# Patient Record
Sex: Male | Born: 2005 | Race: Black or African American | Hispanic: No | Marital: Single | Smoking: Never smoker
Health system: Southern US, Community
[De-identification: ages and names within clinical notes are randomized; demographics above are authoritative.]

---
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
## 2023-02-10 ENCOUNTER — Encounter: Payer: Self-pay | Admitting: *Deleted

## 2024-02-03 ENCOUNTER — Ambulatory Visit (HOSPITAL_COMMUNITY)
Admission: EM | Admit: 2024-02-03 | Discharge: 2024-02-04 | Disposition: A | Discharge status: Other Acute Inpatient Hospital | Attending: Psychiatry | Admitting: Psychiatry

## 2024-02-03 DIAGNOSIS — F515 Nightmare disorder: Secondary | ICD-10-CM | POA: Diagnosis not present

## 2024-02-03 DIAGNOSIS — R45851 Suicidal ideations: Secondary | ICD-10-CM | POA: Insufficient documentation

## 2024-02-03 DIAGNOSIS — F322 Major depressive disorder, single episode, severe without psychotic features: Secondary | ICD-10-CM | POA: Diagnosis not present

## 2024-02-03 LAB — CBC WITH DIFFERENTIAL/PLATELET
Abs Immature Granulocytes: 0.02 K/uL (ref 0.00–0.07)
Basophils Absolute: 0.1 K/uL (ref 0.0–0.1)
Basophils Relative: 1 %
Eosinophils Absolute: 0.1 K/uL (ref 0.0–0.5)
Eosinophils Relative: 1 %
HCT: 48.7 % (ref 39.0–52.0)
Hemoglobin: 16.1 g/dL (ref 13.0–17.0)
Immature Granulocytes: 0 %
Lymphocytes Relative: 33 %
Lymphs Abs: 2.5 K/uL (ref 0.7–4.0)
MCH: 30.7 pg (ref 26.0–34.0)
MCHC: 33.1 g/dL (ref 30.0–36.0)
MCV: 92.8 fL (ref 80.0–100.0)
Monocytes Absolute: 0.8 K/uL (ref 0.1–1.0)
Monocytes Relative: 10 %
Neutro Abs: 4.2 K/uL (ref 1.7–7.7)
Neutrophils Relative %: 55 %
Platelets: 233 K/uL (ref 150–400)
RBC: 5.25 MIL/uL (ref 4.22–5.81)
RDW: 13.6 % (ref 11.5–15.5)
WBC: 7.6 K/uL (ref 4.0–10.5)
nRBC: 0 % (ref 0.0–0.2)

## 2024-02-03 LAB — COMPREHENSIVE METABOLIC PANEL WITH GFR
ALT: 15 U/L (ref 0–44)
AST: 20 U/L (ref 15–41)
Albumin: 4.4 g/dL (ref 3.5–5.0)
Alkaline Phosphatase: 71 U/L (ref 38–126)
Anion gap: 10 (ref 5–15)
BUN: 13 mg/dL (ref 6–20)
CO2: 26 mmol/L (ref 22–32)
Calcium: 9.5 mg/dL (ref 8.9–10.3)
Chloride: 104 mmol/L (ref 98–111)
Creatinine, Ser: 1.22 mg/dL (ref 0.61–1.24)
GFR, Estimated: >60 mL/min (ref >60)
Glucose, Bld: 81 mg/dL (ref 70–99)
Potassium: 3.7 mmol/L (ref 3.5–5.1)
Sodium: 140 mmol/L (ref 135–145)
Total Bilirubin: 1 mg/dL (ref 0.0–1.2)
Total Protein: 7.7 g/dL (ref 6.5–8.1)

## 2024-02-03 LAB — URINALYSIS, ROUTINE W REFLEX MICROSCOPIC
Bacteria, UA: NONE SEEN
Bilirubin Urine: NEGATIVE
Glucose, UA: NEGATIVE mg/dL
Hgb urine dipstick: NEGATIVE
Ketones, ur: 5 mg/dL — AB
Leukocytes,Ua: NEGATIVE
Nitrite: NEGATIVE
Protein, ur: 30 mg/dL — AB
Specific Gravity, Urine: 1.029 (ref 1.005–1.030)
pH: 7 (ref 5.0–8.0)

## 2024-02-03 LAB — LIPID PANEL
Cholesterol: 188 mg/dL (ref 0–200)
HDL: 55 mg/dL (ref >40)
LDL Cholesterol: 120 mg/dL — ABNORMAL HIGH (ref 0–99)
Total CHOL/HDL Ratio: 3.4 ratio
Triglycerides: 67 mg/dL (ref <150)
VLDL: 13 mg/dL (ref 0–40)

## 2024-02-03 LAB — POCT URINE DRUG SCREEN - MANUAL ENTRY (I-SCREEN)
POC Amphetamine UR: NOT DETECTED
POC Buprenorphine (BUP): NOT DETECTED
POC Cocaine UR: NOT DETECTED
POC Marijuana UR: NOT DETECTED
POC Methadone UR: NOT DETECTED
POC Methamphetamine UR: NOT DETECTED
POC Morphine: NOT DETECTED
POC Oxazepam (BZO): NOT DETECTED
POC Oxycodone UR: NOT DETECTED
POC Secobarbital (BAR): NOT DETECTED

## 2024-02-03 LAB — MAGNESIUM: Magnesium: 2.1 mg/dL (ref 1.7–2.4)

## 2024-02-03 LAB — ETHANOL: Alcohol, Ethyl (B): <15 mg/dL (ref <15)

## 2024-02-03 LAB — TSH: TSH: 2.363 u[IU]/mL (ref 0.350–4.500)

## 2024-02-03 LAB — HEMOGLOBIN A1C
Hgb A1c MFr Bld: 4.7 % — ABNORMAL LOW (ref 4.8–5.6)
Mean Plasma Glucose: 88.19 mg/dL

## 2024-02-03 MED ORDER — HALOPERIDOL 5 MG PO TABS: 5.0000 mg | ORAL_TABLET | Freq: Three times a day (TID) | ORAL | Status: DC | PRN

## 2024-02-03 MED ORDER — DIPHENHYDRAMINE HCL 50 MG PO CAPS: 50.0000 mg | ORAL_CAPSULE | Freq: Three times a day (TID) | ORAL | Status: DC | PRN

## 2024-02-03 MED ORDER — ACETAMINOPHEN 325 MG PO TABS: 650.0000 mg | ORAL_TABLET | Freq: Four times a day (QID) | ORAL | Status: DC | PRN

## 2024-02-03 MED ORDER — DIPHENHYDRAMINE HCL 50 MG/ML IJ SOLN: 50.0000 mg | Freq: Three times a day (TID) | INTRAMUSCULAR | Status: DC | PRN

## 2024-02-03 MED ORDER — ALUM & MAG HYDROXIDE-SIMETH 200-200-20 MG/5ML PO SUSP: 30.0000 mL | ORAL | Status: DC | PRN

## 2024-02-03 MED ORDER — MAGNESIUM HYDROXIDE 400 MG/5ML PO SUSP: 30.0000 mL | Freq: Every day | ORAL | Status: DC | PRN

## 2024-02-03 MED ORDER — HALOPERIDOL LACTATE 5 MG/ML IJ SOLN: 10.0000 mg | Freq: Three times a day (TID) | INTRAMUSCULAR | Status: DC | PRN

## 2024-02-03 MED ORDER — LORAZEPAM 2 MG/ML IJ SOLN: 2.0000 mg | Freq: Three times a day (TID) | INTRAMUSCULAR | Status: DC | PRN

## 2024-02-03 MED ORDER — HALOPERIDOL LACTATE 5 MG/ML IJ SOLN: 5.0000 mg | Freq: Three times a day (TID) | INTRAMUSCULAR | Status: DC | PRN

## 2024-02-03 NOTE — ED Notes (Signed)
 Patient offered snacks. Patient accepted two bags of chips. Patient is eating snack and resting in recliner.

## 2024-02-03 NOTE — ED Notes (Signed)
 Patient currently resting in recliner. RR even and unlabored, appearing in no noted distress. Environmental check complete

## 2024-02-03 NOTE — ED Notes (Signed)
 Patient calm and cooperative during initial nurse shift assessment. Pt observed/assessed resting in recliner. Patient denies any SI/AVH/ HI. Patient encouraged to eat meals and drink fluids. Patient refused. Patient states that currently he has no desire to eat right now. RR even and unlabored, appearing in no noted distress. Environmental check complete.

## 2024-02-03 NOTE — BH Assessment (Signed)
 Comprehensive Clinical Assessment (CCA) Note  02/03/2024 Spencer Bradley [id number]  DISPOSITION: Patient has been recommended for an inpatient admission to assist with stabilization.   The patient demonstrates the following risk factors for suicide: Chronic risk factors for suicide include: psychiatric disorder of SI . Acute risk factors for suicide include: loss (financial, interpersonal, professional). Protective factors for this patient include: coping skills. Considering these factors, the overall suicide risk at this point appears to be high. Patient is not appropriate for outpatient follow up.   Patient is a 18 year old male that presents this date to [facility] voluntary brought in by [employer] [facility] police after he was reported as a missing person the last 4 days by his family. Patient continues to report passive SI although denies any active plan or intent at the time of assessment. Patient denies any HI or AVH. Patient denies any previous history of a mental health disorder or prior attempts or gestures at self harm prior to this incident. 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 Patient states on Tuesday of this week he, decided to take off and stay with a friend, and did not inform anyone of his departure from campus.   Patient states at that time he became suicidal and took his friend's gun and put it to his head, although decided, not to do it. Patient states he did not want to get his friend in trouble since he was not aware of the circumstances associated with his actions nor was his friend aware that he left campus without telling anyone. Patient states he has been feeling, so depressed over the last two weeks, reporting current symptoms to include feeling hopeless and worthless due to relationship issues (patient states his girlfriend recently got pregnant and had an abortion) that he feels guilty over, poor grades in school and the recent loss of his grandfather  which his family did not inform him of until after the fact since he, had so much going on anyway. Patient also states he received multiple injuries while playing football that is keeping him from playing the season at [employer]. Patient states he became very overwhelmed earlier this week and as mentioned above left campus on Tuesday to go stay with his friend for 4 days that resulted in his parents filing a missing persons report. Patient denies any current OP services associated with mental health, prior inpatient admissions or diagnosis. Patient states after he decided, not to shoot himself, on Tuesday he thought about, jumping off a nearby parking deck. Patient currently denies access to weapons, legal charges or SA issues.  Patient is alert and oriented x 5. Patient speaks in a low soft voice that is difficult to understand at times and is not forthcoming with responses rendering limited history. Patient's memory appears to be intact with thoughts organized. Patient's mood is depressed with affect congruent. Patient does not appear to be responding to internal stimuli. Patient agrees to an voluntary admission at this time and meets criteria for an inpatient admission.     Chief Complaint:  Chief Complaint  Patient presents with   Suicidal Thoughts   Depression   Visit Diagnosis: MDD recurrent without psychotic features, severe     CCA Screening, Triage and Referral (STR)  Patient Reported Information How did you hear about us ? Legal System  What Is the Reason for Your Visit/Call Today? Spencer Bradley is a 18 year old male presenting to [facility] via [employer] [facility] police vol. Pt states he  was brought here today due to his family reporting him as a missing person. Pt states 4 days ago he went missing so he could act on his suicidal thougths.Pt states that he wanted to either shoot himself or jump off a parking deck. Pt currently has SI but does not have a plan and would not act on past plans if he left today. Pt  denies AVH, HI, and substance use. Pt states he would like therpay services and is not open to medication. Pt states he has no mental health diagnosis.  How Long Has This Been Causing You Problems? 1 wk - 1 month  What Do You Feel Would Help You the Most Today? Treatment for Depression or other mood problem   Have You Recently Had Any Thoughts About Hurting Yourself? Yes  Are You Planning to Commit Suicide/Harm Yourself At This time? No   Flowsheet Row ED from 02/03/2024 in [facility]  C-SSRS RISK CATEGORY High Risk    Have you Recently Had Thoughts About Hurting Someone Spencer Bradley? No  Are You Planning to Harm Someone at This Time? No  Explanation: NA   Have You Used Any Alcohol or Drugs in the Past 24 Hours? No  How Long Ago Did You Use Drugs or Alcohol? NA What Did You Use and How Much? NA  Do You Currently Have a Therapist/Psychiatrist? No  Name of Therapist/Psychiatrist: NA   Have You Been Recently Discharged From Any Office Practice or Programs? No  Explanation of Discharge From Practice/Program: NA    CCA Screening Triage Referral Assessment Type of Contact: Face-to-Face  Telemedicine Service Delivery:  02/03/2024 Is this Initial or Reassessment? initial  Date Telepsych consult ordered in [facility]:  02/03/2024  Time Telepsych consult ordered in [facility]:  02/03/2024  Location of Assessment: [facility] Services  Provider Location: [facility] Assessment Services   Collateral Involvement: None at this time   Does Patient Have a Automotive engineer Guardian? No  Legal Guardian Contact Information: NA  Copy of Legal Guardianship Form: -- (NA)  Legal Guardian Notified of Arrival: -- (NA)  Legal Guardian Notified of Pending Discharge: -- (NA)  If Minor and Not Living with Parent(s), Who has Custody? NA  Is CPS involved or ever been involved? Never  Is APS involved or ever been involved? Never   Patient Determined To Be At Risk for Harm  To Self or Others Based on Review of Patient Reported Information or Presenting Complaint? Yes, for Self-Harm  Method: No Plan  Availability of Means: No access or NA  Intent: Vague intent or NA  Notification Required: No need or identified person  Additional Information for Danger to Others Potential: -- (NA)  Additional Comments for Danger to Others Potential: None noted  Are There Guns or Other Weapons in Your Home? No  Types of Guns/Weapons: NA  Are These Weapons Safely Secured?                            -- (NA)  Who Could Verify You Are Able To Have These Secured: NA  Do You Have any Outstanding Charges, Pending Court Dates, Parole/Probation? Patient denies  Contacted To Inform of Risk of Harm To Self or Others: Other: Comment (NA)    Does Patient Present under Involuntary Commitment? No    [place] of Residence: [place]   Patient Currently Receiving the Following Services: Not Receiving Services   Determination of Need: Urgent (  48 hours)   Options For Referral: Inpatient Hospitalization     CCA Biopsychosocial Patient Reported Schizophrenia/Schizoaffective Diagnosis in Past: No   Strengths: Patient is willing to participate in treatment   Mental Health Symptoms Depression:  Change in energy/activity; Worthlessness; Hopelessness   Duration of Depressive symptoms: Duration of Depressive Symptoms: Less than two weeks   Mania:  None   Anxiety:   Difficulty concentrating; Worrying; Tension   Psychosis:  None   Duration of Psychotic symptoms:    Trauma:  None   Obsessions:  None   Compulsions:  None   Inattention:  None   Hyperactivity/Impulsivity:  None   Oppositional/Defiant Behaviors:  None   Emotional Irregularity:  Chronic feelings of emptiness   Other Mood/Personality Symptoms:  None noted    Mental Status Exam Appearance and self-care  Stature:  Tall   Weight:  Average weight   Clothing:  Casual   Grooming:  Normal    Cosmetic use:  None   Posture/gait:  Normal   Motor activity:  Not Remarkable   Sensorium  Attention:  Normal   Concentration:  Normal   Orientation:  X5   Recall/memory:  Normal   Affect and Mood  Affect:  Anxious; Depressed   Mood:  Depressed; Anxious   Relating  Eye contact:  Normal   Facial expression:  Anxious; Depressed   Attitude toward examiner:  Cooperative   Thought and Language  Speech flow: Clear and Coherent   Thought content:  Appropriate to Mood and Circumstances   Preoccupation:  None   Hallucinations:  None   Organization:  Goal-directed; Intact   [employer] of Knowledge:  Fair   Intelligence:  Average   Abstraction:  Normal   Judgement:  Fair   Brewing technologist   Insight:  Fair   Decision Making:  Normal   Social Functioning  Social Maturity:  Responsible   Social Judgement:  Normal   Stress  Stressors:  Relationship; School; Grief/losses   Coping Ability:  Human resources officer Deficits:  Activities of daily living   Supports:  Family     Religion: Religion/Spirituality Are You A Religious Person?: No How Might This Affect Treatment?: NA  Leisure/Recreation: Leisure / Recreation Do You Have Hobbies?: No  Exercise/Diet: Exercise/Diet Do You Exercise?: No Have You Gained or Lost A Significant Amount of Weight in the Past Six Months?: No Do You Follow a Special Diet?: No Do You Have Any Trouble Sleeping?: No   CCA Employment/Education Employment/Work Situation: Employment / Work Situation Employment Situation: Surveyor, minerals Job has Been Impacted by Current Illness: No Has Patient ever Been in the [employer]?: No  Education: Education Is Patient Currently Attending School?: Yes School Currently Attending: [employer] Last Grade Completed: 15 Did You Product manager?: Yes What Type of College Degree Do you Have?: Currently a junior at [employer] in Psychology Did You Have An Individualized  Education Program (IIEP): No Did You Have Any Difficulty At School?: No Patient's Education Has Been Impacted by Current Illness: No   CCA Family/Childhood History Family and Relationship History: Family history Marital status: Single Does patient have children?: No  Childhood History:  Childhood History By whom was/is the patient raised?: Both parents Did patient suffer any verbal/emotional/physical/sexual abuse as a child?: No Did patient suffer from severe childhood neglect?: No Has patient ever been sexually abused/assaulted/raped as an adolescent or adult?: No Was the patient ever a victim of a crime or a disaster?: No Witnessed domestic  violence?: No Has patient been affected by domestic violence as an adult?: No       CCA Substance Use Alcohol/Drug Use: Alcohol / Drug Use Pain Medications: See MAR Prescriptions: See MAR Over the Counter: See MAR History of alcohol / drug use?: No history of alcohol / drug abuse Longest period of sobriety (when/how long): NA Negative Consequences of Use:  (NA) Withdrawal Symptoms:  (NA)                         ASAM's:  Six Dimensions of Multidimensional Assessment  Dimension 1:  Acute Intoxication and/or Withdrawal Potential:   Dimension 1:  Description of individual's past and current experiences of substance use and withdrawal: NA  Dimension 2:  Biomedical Conditions and Complications:   Dimension 2:  Description of patient's biomedical conditions and  complications: NA  Dimension 3:  Emotional, Behavioral, or Cognitive Conditions and Complications:  Dimension 3:  Description of emotional, behavioral, or cognitive conditions and complications: NA  Dimension 4:  Readiness to Change:  Dimension 4:  Description of Readiness to Change criteria: NA  Dimension 5:  Relapse, Continued use, or Continued Problem Potential:  Dimension 5:  Relapse, continued use, or continued problem potential critiera description: NA  Dimension  6:  Recovery/Living Environment:  Dimension 6:  Recovery/Iiving environment criteria description: NA  ASAM Severity Score:    ASAM Recommended Level of Treatment: ASAM Recommended Level of Treatment:  (NA)   Substance use Disorder (SUD) Substance Use Disorder (SUD)  Checklist Symptoms of Substance Use:  (NA)  Recommendations for Services/Supports/Treatments: Recommendations for Services/Supports/Treatments Recommendations For Services/Supports/Treatments:  (NA)  Disposition Recommendation per psychiatric provider: We recommend inpatient psychiatric hospitalization when medically cleared. Patient is under voluntary admission status at this time; please IVC if attempts to leave hospital.   DSM5 Diagnoses: There are no active problems to display for this patient.    Referrals to Alternative Service(s): Referred to Alternative Service(s):   Place:   Date:   Time:    Referred to Alternative Service(s):   Place:   Date:   Time:    Referred to Alternative Service(s):   Place:   Date:   Time:    Referred to Alternative Service(s):   Place:   Date:   Time:     Alm LITTIE Furth, LCAS

## 2024-02-03 NOTE — ED Notes (Signed)
 Pt presented to [facility] voluntarily and admitted to continuous assessment unit w/ c/o suicidal thoughts and depression. Hx includes: none. Pt went missing for 4 days and was found at a friend's house. Pt had planned to shoot himself or jump off a parking deck per chart review. When asked about triggers pt just stated that he had a lot going on. Denies current SI/HI/AVH. Pt depressed, sad, calm and cooperative. Skin assessment: remarkable for abrasion to R elbow from football . Oriented to unit. Denies need of anything at this time. Pt in NAD at this time. Encouragement and support given. Will continue to monitor.

## 2024-02-03 NOTE — Progress Notes (Signed)
   02/03/24 1623  [facility] Triage Screening (Walk-ins at [facility] only)  How Did You Hear About Us ? Legal System  What Is the Reason for Your Visit/Call Today? Spencer Bradley is a 18 year old male presenting to [facility] via [employer] [facility] police vol. Pt states he was brought here today due to his family reporting him as a missing person. Pt states 4 days ago he went missing so he could act on his suicidal thougths.Pt states that he wanted to either shoot himself or jump off a parking deck. Pt currently has SI but does not have a plan and would not act on past plans if he left today. Pt denies AVH, HI, and substance use. Pt states he would like therpay services and is not open to medication. Pt states he has no mental health diagnosis.  How Long Has This Been Causing You Problems? <Week  Have You Recently Had Any Thoughts About Hurting Yourself? Yes  How long ago did you have thoughts about hurting yourself? Today  Are You Planning to Commit Suicide/Harm Yourself At This time? No  Have you Recently Had Thoughts About Hurting Someone [name]? No  Are You Planning To Harm Someone At This Time? No  Physical Abuse Denies  Verbal Abuse Denies  Sexual Abuse Denies  Exploitation of patient/patient's resources Denies  Self-Neglect Denies  Are you currently experiencing any auditory, visual or other hallucinations? No  Have You Used Any Alcohol or Drugs in the Past 24 Hours? No  Do you have any current medical co-morbidities that require immediate attention? No  Clinician description of patient physical appearance/behavior: Pt appears anxiois and is avoiding eye contact.  What Do You Feel Would Help You the Most Today? Treatment for Depression or other mood problem;Medication(s)  If access to [facility]gent [facility] was not available, would you have sought care in the Emergency Department? No  Determination of Need Urgent (48 hours)  Options For Referral Inpatient Hospitalization;BH Urgent Care;Intensive Outpatient Therapy;Outpatient  Therapy  Determination of Need filed? Yes
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
## 2024-02-04 ENCOUNTER — Other Ambulatory Visit: Payer: Self-pay

## 2024-02-04 ENCOUNTER — Inpatient Hospital Stay (HOSPITAL_COMMUNITY)
Admission: AD | Admit: 2024-02-04 | Discharge: 2024-02-07 | DRG: 885 | Disposition: A | Discharge status: Home / Self Care | Source: Intra-hospital

## 2024-02-04 ENCOUNTER — Encounter (HOSPITAL_COMMUNITY): Payer: Self-pay | Admitting: Psychiatry

## 2024-02-04 DIAGNOSIS — F332 Major depressive disorder, recurrent severe without psychotic features: Principal | ICD-10-CM | POA: Diagnosis present

## 2024-02-04 DIAGNOSIS — R45851 Suicidal ideations: Secondary | ICD-10-CM | POA: Diagnosis present

## 2024-02-04 DIAGNOSIS — F419 Anxiety disorder, unspecified: Secondary | ICD-10-CM | POA: Diagnosis present

## 2024-02-04 MED ORDER — TRAZODONE HCL 50 MG PO TABS: 50.0000 mg | ORAL_TABLET | Freq: Every evening | ORAL | Status: DC | PRN

## 2024-02-04 MED ORDER — MAGNESIUM HYDROXIDE 400 MG/5ML PO SUSP: 30.0000 mL | Freq: Every day | ORAL | Status: DC | PRN

## 2024-02-04 MED ORDER — DIPHENHYDRAMINE HCL 50 MG/ML IJ SOLN: 50.0000 mg | Freq: Three times a day (TID) | INTRAMUSCULAR | Status: DC | PRN

## 2024-02-04 MED ORDER — ACETAMINOPHEN 325 MG PO TABS: 650.0000 mg | ORAL_TABLET | Freq: Four times a day (QID) | ORAL | Status: DC | PRN

## 2024-02-04 MED ORDER — DIPHENHYDRAMINE HCL 25 MG PO CAPS: 50.0000 mg | ORAL_CAPSULE | Freq: Three times a day (TID) | ORAL | Status: DC | PRN

## 2024-02-04 MED ORDER — HALOPERIDOL LACTATE 5 MG/ML IJ SOLN: 5.0000 mg | Freq: Three times a day (TID) | INTRAMUSCULAR | Status: DC | PRN

## 2024-02-04 MED ORDER — LORAZEPAM 2 MG/ML IJ SOLN: 2.0000 mg | Freq: Three times a day (TID) | INTRAMUSCULAR | Status: DC | PRN

## 2024-02-04 MED ORDER — HYDROXYZINE HCL 25 MG PO TABS: 25.0000 mg | ORAL_TABLET | Freq: Three times a day (TID) | ORAL | Status: DC | PRN

## 2024-02-04 MED ORDER — HALOPERIDOL LACTATE 5 MG/ML IJ SOLN: 10.0000 mg | Freq: Three times a day (TID) | INTRAMUSCULAR | Status: DC | PRN

## 2024-02-04 MED ORDER — HALOPERIDOL 5 MG PO TABS: 5.0000 mg | ORAL_TABLET | Freq: Three times a day (TID) | ORAL | Status: DC | PRN

## 2024-02-04 MED ORDER — LORAZEPAM 2 MG/ML IJ SOLN
2.0000 mg | Freq: Three times a day (TID) | INTRAMUSCULAR | Status: DC | PRN
Start: 2024-02-04 — End: 2024-02-07

## 2024-02-04 MED ORDER — ALUM & MAG HYDROXIDE-SIMETH 200-200-20 MG/5ML PO SUSP: 30.0000 mL | ORAL | Status: DC | PRN

## 2024-02-04 NOTE — Tx Team (Signed)
 Initial Treatment Plan 02/04/2024 5:13 AM Spencer Bradley FMW:[id number]    PATIENT STRESSORS: Loss of grandfathers   Marital or family conflict   Traumatic event     PATIENT STRENGTHS: Average or above average intelligence  Communication skills  Supportive family/friends    PATIENT IDENTIFIED PROBLEMS: Depression  Suicidal Ideation          I don't know         DISCHARGE CRITERIA:  Improved stabilization in mood, thinking, and/or behavior Motivation to continue treatment in a less acute level of care Need for constant or close observation no longer present Verbal commitment to aftercare and medication compliance  PRELIMINARY DISCHARGE PLAN: Outpatient therapy Return to previous living arrangement Return to previous work or school arrangements  PATIENT/FAMILY INVOLVEMENT: This treatment plan has been presented to and reviewed with the patient, Teacher, early years/pre.  The patient and family have been given the opportunity to ask questions and make suggestions.  Effie Naomie Norris, RN 02/04/2024, 5:13 AM
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
## 2024-02-04 NOTE — Group Note (Signed)
 Date:  02/04/2024 Time:  4:18 PM  Group Topic/Focus:  Managing Feelings:   The focus of this group is to identify what feelings patients have difficulty handling and develop a plan to handle them in a healthier way upon discharge.    Participation Level:  Did Not Attend    Spencer Bradley 02/04/2024, 4:18 PM

## 2024-02-04 NOTE — B[facility] Group Notes (Signed)
 B[facility] Group Notes:  (Nursing/MHT/Case Management/Adjunct)  Date:  02/04/2024  Time:  9:10 PM  Type of Therapy:  Wrap-up group  Participation Level:  Active  Participation Quality:  Appropriate  Affect:  Appropriate  Cognitive:  Appropriate  Insight:  Appropriate  Engagement in Group:  Engaged  Modes of Intervention:  Education  Summary of Progress/Problems:Goal to sleep. Rated day 8/10.  Spencer Bradley 02/04/2024, 9:10 PM

## 2024-02-04 NOTE — Group Note (Signed)
 Date:  02/04/2024 Time:  9:16 AM  Group Topic/Focus:  Goals Group:   The focus of this group is to help patients establish daily goals to achieve during treatment and discuss how the patient can incorporate goal setting into their daily lives to aide in recovery.    Participation Level:  Did Not Attend  Participation Quality:  NA  Affect:  NA  Cognitive:  NA  Insight: None  Engagement in Group:  NA  Modes of Intervention:  NA  Additional Comments:  NA  Teria Khachatryan A Mick Tanguma 02/04/2024, 9:16 AM

## 2024-02-04 NOTE — Progress Notes (Signed)
   02/04/24 2210  Psych Admission Type (Psych Patients Only)  Admission Status Voluntary  Psychosocial Assessment  Patient Complaints None  Eye Contact Brief  Facial Expression Flat  Affect Appropriate to circumstance  Speech Logical/coherent  Interaction Assertive  Motor Activity Other (Comment) (WNL)  Appearance/Hygiene Unremarkable  Behavior Characteristics Cooperative;Appropriate to situation  Mood Depressed;Pleasant  Thought Process  Coherency WDL  Content WDL  Delusions None reported or observed  Perception WDL  Hallucination None reported or observed  Judgment Poor  Confusion None  Danger to Self  Current suicidal ideation? Denies
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
## 2024-02-04 NOTE — Progress Notes (Signed)
 Patient isolative to his room most of the shift, he remains guarded on approach, but denies SI, HI & AVH.

## 2024-02-04 NOTE — Plan of Care (Signed)
   Problem: Education: Goal: Knowledge of [place] [facility] Education information/materials will improve Outcome: Progressing

## 2024-02-04 NOTE — ED Notes (Signed)
 Patient currently sleeping and resting in recliner. RR even and unlabored, appearing in no noted distress. Environmental check complete

## 2024-02-04 NOTE — [facility] Suicide Risk Assessment (Signed)
 [facility] Admission Suicide Risk Assessment   Total Time spent with patient: 45 minutes Principal Problem: Major depressive disorder, recurrent severe without psychotic features (HCC) Diagnosis:  Principal Problem:   Major depressive disorder, recurrent severe without psychotic features (HCC)   Suicide Risk: The patient presents with acute risk factors for suicide including active depressive symptoms, psychosocial stressors (sports injury, relationship stressors), recent SI (last endorsed on 4 days ago - Tuesday, 9/2), and recent suicidal gesture. Specifically, the patient took a friend's gun and had a plan to shoot self or jump off something to end his life. Additional chronic risk factors include male gender, poor coping skills, impulsivity, negative view towards medications and no current outpatient follow up. Risk factors are mitigated by resolution of SI prior to hospitalization (denying suicidal thoughts x3 days), open to inpatient admission and working with therapy, reports of good family support (parents, siblings, friends), no substance use concerns, domiciled and attending school. He today did present today with future orientation, discussing plans to pursue professional football and reconnect with friends.   At this time, current risk of suicide is deemed moderate, largely based on ongoing guardedness on interview, severity of recent suicidal thoughts and gesture as well as declining medications. We will further mitigate risk with ongoing hospitalization, will discuss medications further and encourage him to consider this and will provide group therapy. Prior to discharge we will ensure he is reconnected with therapy services.    I certify that inpatient services furnished can reasonably be expected to improve the patient's condition.   Leita LOISE Arts, MD 02/04/2024, 3:05 PM

## 2024-02-04 NOTE — Progress Notes (Signed)
 Pt is a 18 year old AAM admitted due to increasing depression and suicidal ideation.  Pt has lost two grandfathers in the last two years and was upset that parents did not tell him about the first.  Pt's girlfriend also has recently had an abortion and Pt feels guilty that he may have pressured her into th is decision.  Pt states that he feels overwhelmed.  Pt was reported missing for four days and was brought in to the hospital by [facility] police.  Pt appears depressed, but is cooperative with the admission process. Pt oriented to unit.   02/04/24 0506  Psych Admission Type (Psych Patients Only)  Admission Status Voluntary  Psychosocial Assessment  Patient Complaints Depression;Crying spells  Eye Contact Fair  Facial Expression Sad  Affect Apprehensive;Appropriate to circumstance  Speech Logical/coherent  Interaction Assertive  Motor Activity Other (Comment) (WDL)  Appearance/Hygiene In scrubs  Behavior Characteristics Appropriate to situation  Mood Depressed  Thought Process  Coherency WDL  Content WDL  Delusions None reported or observed  Perception WDL  Hallucination None reported or observed  Judgment Poor  Confusion None  Danger to Self  Current suicidal ideation? Denies  Description of Suicide Plan denies  Agreement Not to Harm Self Yes  Description of Agreement verbal  Danger to Others  Danger to Others None reported or observed

## 2024-02-04 NOTE — Progress Notes (Signed)
(  Sleep Hours) - 0.25 hours (Any PRNs that were needed, meds refused, or side effects to meds)-  None (Any disturbances and when (visitation, over night)- Admitted at 0430 (Concerns raised by the patient)-  None (SI/HI/AVH)-  Denies

## 2024-02-04 NOTE — Plan of Care (Signed)
  Problem: Activity: Goal: Interest or engagement in activities will improve Outcome: Not Progressing   

## 2024-02-05 NOTE — Progress Notes (Signed)
 D. Pt presented friendly, observed standing at the nursing station conversing with staff. Pt asked about discharge and when he will be speaking with the provider. Pt reported sleeping well last night, described his appetite as 'fair', concentration as 'good', and energy level as 'high'. Per pt's self inventory, pt rated his depression,hopelessness and anxiety all 0's. Pt's stated goal today is to go to rec/ courtyard). Pt currently denies SI/HI and AVH and doesn't appear to be responding to internal stimuli  A. Labs and vitals monitored. Pt  supported emotionally and encouraged to express concerns and ask questions.   R. Pt remains safe with 15 minute checks. Will continue POC.

## 2024-02-05 NOTE — Group Note (Signed)
 Date:  02/05/2024 Time:  10:03 AM  Group Topic/Focus:  Coping Mechanisms This group focused on coping mechanisms and understanding the importance of developing healthier ways to manage stress, emotional distress, and mental health challenges. The group explored healthy vs. nonhealthy coping mechanisms, emphasizing how some behaviors like substance abuse or avoidance can worsen mental health, while others like adequate sleep and food and relaxation techniques can lead to better emotional regulation and overall well-being.  A worksheet was provided to participants and highlighted adaptive vs. maladaptive coping mechanisms. Participants were encouraged to share their personal experiences and engage in conversation focused on resilience, improving self-awareness, and practicing deep breathing as a positive coping mechanism.    Participation Level:  Minimal  Participation Quality:  Appropriate and Attentive  Affect:  Appropriate  Cognitive:  Alert and Appropriate  Insight: Improving and Lacking  Engagement in Group:  Improving and Lacking  Modes of Intervention:  Discussion, Education, and Problem-solving  Additional Comments:  Pt attended this group, actively listened but did not verbally participate.  Spencer Bradley 02/05/2024, 10:03 AM

## 2024-02-05 NOTE — Group Note (Signed)
 Date:  02/05/2024 Time:  9:23 AM  Group Topic/Focus:  Goals Group:   The focus of this group is to help patients establish daily goals to achieve during treatment and discuss how the patient can incorporate goal setting into their daily lives to aide in recovery.    Participation Level:  Active  Participation Quality:  Appropriate  Affect:  Appropriate  Cognitive:  Alert and Appropriate  Insight: Appropriate and Improving  Engagement in Group:  Engaged and Improving  Modes of Intervention:  Discussion and Exploration  Additional Comments:  Pt attended and participated in goals group.  Kristi HERO Marquette Blodgett 02/05/2024, 9:23 AM

## 2024-02-05 NOTE — Progress Notes (Signed)
(  Sleep Hours) -6.0 (Any PRNs that were needed, meds refused, or side effects to meds)- none (Any disturbances and when (visitation, over night)-none (Concerns raised by the patient)- none (SI/HI/AVH)- Denies all

## 2024-02-05 NOTE — Plan of Care (Signed)
   Problem: Education: Goal: Knowledge of [place] [facility] Education information/materials will improve Outcome: Progressing Goal: Emotional status will improve Outcome: Progressing Goal: Mental status will improve Outcome: Progressing Goal: Verbalization of understanding the information provided will improve Outcome: Progressing

## 2024-02-05 NOTE — Group Note (Signed)
 Date:  02/05/2024 Time:  3:58 PM  Group Topic/Focus:  The focus of this group is to introduce the topic of wellness and how collage can be used as a creative outlet for expressing emotions, reducing anxiety, while fostering group cohesion and enabling personal insight and healing.    Participation Level:  Active  Participation Quality:  Appropriate and Attentive  Affect:  Appropriate  Cognitive:  Alert and Appropriate  Insight: Appropriate and Good  Engagement in Group:  Engaged  Modes of Intervention:  Activity, Discussion, Exploration, Rapport Building, Socialization, and Support  Additional Comments:    Spencer Bradley 02/05/2024, 3:58 PM

## 2024-02-05 NOTE — Plan of Care (Signed)
   Problem: Education: Goal: Emotional status will improve Outcome: Progressing Goal: Mental status will improve Outcome: Progressing   Problem: Activity: Goal: Interest or engagement in activities will improve Outcome: Progressing

## 2024-02-05 NOTE — Progress Notes (Signed)
   02/05/24 2113  Psych Admission Type (Psych Patients Only)  Admission Status Voluntary  Psychosocial Assessment  Patient Complaints None  Eye Contact Brief  Facial Expression Flat  Affect Appropriate to circumstance  Speech Logical/coherent  Interaction Assertive  Motor Activity Other (Comment) (WNL)  Appearance/Hygiene Unremarkable  Behavior Characteristics Appropriate to situation  Mood Pleasant  Thought Process  Coherency WDL  Content WDL  Delusions None reported or observed  Perception WDL  Hallucination None reported or observed  Judgment Poor  Confusion None  Danger to Self  Current suicidal ideation? Denies

## 2024-02-05 NOTE — [facility] Group Notes (Signed)
 [facility] Group Notes:  (Nursing/MHT/Case Management/Adjunct)  Date:  02/05/2024  Time:  2000  Type of Therapy:  Wrap up group  Participation Level:  Active  Participation Quality:  Appropriate, Attentive, Sharing, and Supportive  Affect:  Appropriate  Cognitive:  Alert  Insight:  Improving  Engagement in Group:  Engaged  Modes of Intervention:  Clarification, Education, and Support  Summary of Progress/Problems: Positive thinking and positive change were discussed.   Spencer Bradley 02/05/2024, 9:26 PM
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
## 2024-02-05 NOTE — Group Note (Signed)
 [facility] LCSW Group Therapy Note  Date/Time:  @TD @ 10:00-11:00  Type of Therapy and Topic:  Group Therapy:  Personal Bill of Rights  Participation Level:  Did Not Attend   Description of Group This process group involved a discussion with and between patients about Understanding self.   The difference between healthy and unhealthy coping skills was described, then examples were elicited from group members for their Personal bill of Rights.  This then was followed by a discussion about boundaries, respects and what the patient wants, what it is, how important it is, and why we choose the coping techniques we choose.  Participants were encouraged to think about knowing what they want as necessary and positive.  Therapeutic Goals Patient will identify and describe what their boundary consists of Patient will participate in generating ideas when they use their coping skills to address their boundary  Patients will be supportive of one another and receive support from others Patients will understand the need all humans have to   Summary of Patient Progress:  NA  Therapeutic Modalities Brief Solution-Focused Therapy Psychoeducation  Spencer Bradley O Sion Reinders, LCSWA 02/05/2024  1:20 PM

## 2024-02-06 ENCOUNTER — Encounter (HOSPITAL_COMMUNITY): Payer: Self-pay

## 2024-02-06 DIAGNOSIS — F332 Major depressive disorder, recurrent severe without psychotic features: Principal | ICD-10-CM
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
## 2024-02-06 NOTE — BHH Group Notes (Deleted)
 Adult Psychoeducational Group Note  Date:  02/06/2024 Time:  9:27 PM  Group Topic/Focus:  Wrap-Up Group:   The focus of this group is to help patients review their daily goal of treatment and discuss progress on daily workbooks.  Participation Level:  Did Not Attend  Spencer Bradley 02/06/2024, 9:27 PM

## 2024-02-06 NOTE — Group Note (Signed)
 Recreation Therapy Group Note   Group Topic:Stress Management  Group Date: 02/06/2024 Start Time: 0930 End Time: 1000 Facilitators: Laylana Gerwig-McCall, LRT,CTRS Location: [address] Dayroom   Group Topic: Stress Management   Goal Area(s) Addresses:  Patient will actively participate in stress management techniques presented during session.  Patient will successfully identify benefit of practicing stress management post d/c.   Behavioral Response:   Intervention: Relaxation exercise with ambient sound and script   Activity: Guided Imagery. LRT provided education, instruction, and demonstration on practice of visualization via guided imagery. Patient was asked to participate in the technique introduced during session. LRT debriefed including topics of mindfulness, stress management and specific scenarios each patient could use these techniques. Patients were given suggestions of ways to access scripts post d/c and encouraged to explore Youtube and other apps available on smartphones, tablets, and computers.  Education:  Stress Management, Discharge Planning.   Education Outcome: Acknowledges education   Clinical Observations/Individualized Feedback: Group did not take place due to previous group taking recreation therapy group time.    Plan: Continue to engage patient in RT group sessions 2-3x/week.   Clotilda Hafer-McCall, LRT,CTRS 02/06/2024 3:03 PM

## 2024-02-06 NOTE — Plan of Care (Signed)

## 2024-02-06 NOTE — Group Note (Unsigned)
 Date:  02/06/2024 Time:  8:23 AM  Group Topic/Focus:  Goals Group:   The focus of this group is to help patients establish daily goals to achieve during treatment and discuss how the patient can incorporate goal setting into their daily lives to aide in recovery.     Participation Level:  {BHH PARTICIPATION OZCZO:77735}  Participation Quality:  {BHH PARTICIPATION QUALITY:22265}  Affect:  {BHH AFFECT:22266}  Cognitive:  {BHH COGNITIVE:22267}  Insight: {BHH Insight2:20797}  Engagement in Group:  {BHH ENGAGEMENT IN HMNLE:77731}  Modes of Intervention:  {BHH MODES OF INTERVENTION:22269}  Additional Comments:  ***  Spencer Bradley 02/06/2024, 8:23 AM

## 2024-02-06 NOTE — Group Note (Signed)
 Date:  02/06/2024 Time:  10:17 AM  Group Topic/Focus:  Goals Group:   The focus of this group is to help patients establish daily goals to achieve during treatment and discuss how the patient can incorporate goal setting into their daily lives to aide in recovery.    Participation Level:  Active  Participation Quality:  Attentive  Affect:  Appropriate  Cognitive:  Alert  Insight: Good  Engagement in Group:  Engaged  Modes of Intervention:  Education  Additional Comments:    Spencer Bradley 02/06/2024, 10:17 AM

## 2024-02-06 NOTE — Progress Notes (Signed)
 D: Patient is alert, oriented, pleasant, and cooperative. Denies SI, HI, AVH, and verbally contracts for safety. Patient reports he slept good last night without sleeping medication. Patient reports his appetite as good, energy level as high, and concentration as good. Patient rates his depression 0/10, hopelessness 0/10, and anxiety 0/10. Patient denies physical symptoms/pain.    A: Support provided. Patient educated on safety on the unit and medications. Routine safety checks every 15 minutes. Patient stated understanding to tell nurse about any new physical symptoms. Patient understands to tell staff of any needs.     R: No adverse drug reactions noted. Patient remains safe at this time and will continue to monitor.    02/06/24 1000  Psych Admission Type (Psych Patients Only)  Admission Status Voluntary  Psychosocial Assessment  Patient Complaints None  Eye Contact Brief  Facial Expression Animated  Affect Appropriate to circumstance  Speech Logical/coherent  Interaction Assertive  Motor Activity Other (Comment) (WNL)  Appearance/Hygiene Unremarkable  Behavior Characteristics Cooperative;Appropriate to situation  Mood Pleasant  Thought Process  Coherency WDL  Content WDL  Delusions None reported or observed  Perception WDL  Hallucination None reported or observed  Judgment Poor  Confusion None  Danger to Self  Current suicidal ideation? Denies  Agreement Not to Harm Self Yes  Description of Agreement verbal  Danger to Others  Danger to Others None reported or observed

## 2024-02-06 NOTE — Progress Notes (Incomplete)
(  Sleep Hours) - (Any PRNs that were needed, meds refused, or side effects to meds)-  (Any disturbances and when (visitation, over night)- (Concerns raised by the patient)-  (SI/HI/AVH)-

## 2024-02-06 NOTE — BH IP Treatment Plan (Signed)
 Interdisciplinary Treatment and Diagnostic Plan Update  02/06/2024 Time of Session: 10:20AM Spencer Bradley MRN: [id number]  Principal Diagnosis: Major depressive disorder, recurrent severe without psychotic features (HCC)  Secondary Diagnoses: Principal Problem:   Major depressive disorder, recurrent severe without psychotic features (HCC)   Current Medications:  Current Facility-Administered Medications  Medication Dose Route Frequency Provider Last Rate Last Admin   acetaminophen  (TYLENOL ) tablet 650 mg  650 mg Oral Q6H PRN Ajibola, Ene A, NP       alum & mag hydroxide-simeth (MAALOX/MYLANTA) 200-200-20 MG/5ML suspension 30 mL  30 mL Oral Q4H PRN Ajibola, Ene A, NP       haloperidol  (HALDOL ) tablet 5 mg  5 mg Oral TID PRN Ajibola, Ene A, NP       And   diphenhydrAMINE  (BENADRYL ) capsule 50 mg  50 mg Oral TID PRN Ajibola, Ene A, NP       haloperidol  lactate (HALDOL ) injection 5 mg  5 mg Intramuscular TID PRN Ajibola, Ene A, NP       And   diphenhydrAMINE  (BENADRYL ) injection 50 mg  50 mg Intramuscular TID PRN Ajibola, Ene A, NP       And   LORazepam  (ATIVAN ) injection 2 mg  2 mg Intramuscular TID PRN Ajibola, Ene A, NP       haloperidol  lactate (HALDOL ) injection 10 mg  10 mg Intramuscular TID PRN Ajibola, Ene A, NP       And   diphenhydrAMINE  (BENADRYL ) injection 50 mg  50 mg Intramuscular TID PRN Ajibola, Ene A, NP       And   LORazepam  (ATIVAN ) injection 2 mg  2 mg Intramuscular TID PRN Ajibola, Ene A, NP       hydrOXYzine  (ATARAX ) tablet 25 mg  25 mg Oral TID PRN Ajibola, Ene A, NP       magnesium  hydroxide (MILK OF MAGNESIA) suspension 30 mL  30 mL Oral Daily PRN Ajibola, Ene A, NP       traZODone  (DESYREL ) tablet 50 mg  50 mg Oral QHS PRN Ajibola, Ene A, NP       PTA Medications: Medications Prior to Admission  Medication Sig Dispense Refill Last Dose/Taking   fluticasone  (FLONASE ) 50 MCG/ACT nasal spray 1 spray each nostril once a day as needed congestion. (Patient taking  differently: 2 sprays daily as needed for allergies. 2 spray each nostril once a day as needed congestion/allergies) 16 g 2     Patient Stressors: Loss of grandfathers   Marital or family conflict   Traumatic event    Patient Strengths: Average or above average intelligence  Communication skills  Supportive family/friends   Treatment Modalities: Medication Management, Group therapy, Case management,  1 to 1 session with clinician, Psychoeducation, Recreational therapy.   Physician Treatment Plan for Primary Diagnosis: Major depressive disorder, recurrent severe without psychotic features (HCC) Long Term Goal(s):     Short Term Goals:    Medication Management: Evaluate patient's response, side effects, and tolerance of medication regimen.  Therapeutic Interventions: 1 to 1 sessions, Unit Group sessions and Medication administration.  Evaluation of Outcomes: Not Progressing  Physician Treatment Plan for Secondary Diagnosis: Principal Problem:   Major depressive disorder, recurrent severe without psychotic features (HCC)  Long Term Goal(s):     Short Term Goals:       Medication Management: Evaluate patient's response, side effects, and tolerance of medication regimen.  Therapeutic Interventions: 1 to 1 sessions, Unit Group sessions and Medication administration.  Evaluation of Outcomes: Not  Progressing   RN Treatment Plan for Primary Diagnosis: Major depressive disorder, recurrent severe without psychotic features (HCC) Long Term Goal(s): Knowledge of disease and therapeutic regimen to maintain health will improve  Short Term Goals: Ability to remain free from injury will improve, Ability to verbalize frustration and anger appropriately will improve, Ability to demonstrate self-control, Ability to participate in decision making will improve, Ability to verbalize feelings will improve, Ability to disclose and discuss suicidal ideas, Ability to identify and develop effective  coping behaviors will improve, and Compliance with prescribed medications will improve  Medication Management: RN will administer medications as ordered by provider, will assess and evaluate patient's response and provide education to patient for prescribed medication. RN will report any adverse and/or side effects to prescribing provider.  Therapeutic Interventions: 1 on 1 counseling sessions, Psychoeducation, Medication administration, Evaluate responses to treatment, Monitor vital signs and CBGs as ordered, Perform/monitor CIWA, COWS, AIMS and Fall Risk screenings as ordered, Perform wound care treatments as ordered.  Evaluation of Outcomes: Not Progressing   LCSW Treatment Plan for Primary Diagnosis: Major depressive disorder, recurrent severe without psychotic features (HCC) Long Term Goal(s): Safe transition to appropriate next level of care at discharge, Engage patient in therapeutic group addressing interpersonal concerns.  Short Term Goals: Engage patient in aftercare planning with referrals and resources, Increase social support, Increase ability to appropriately verbalize feelings, Increase emotional regulation, Facilitate acceptance of mental health diagnosis and concerns, Facilitate patient progression through stages of change regarding substance use diagnoses and concerns, and Identify triggers associated with mental health/substance abuse issues  Therapeutic Interventions: Assess for all discharge needs, 1 to 1 time with Social worker, Explore available resources and support systems, Assess for adequacy in community support network, Educate family and significant other(s) on suicide prevention, Complete Psychosocial Assessment, Interpersonal group therapy.  Evaluation of Outcomes: Not Progressing   Progress in Treatment: Attending groups: Yes. Participating in groups: Yes. Taking medication as prescribed: Yes. Toleration medication: Yes. Family/Significant other contact made:  Yes, individual(s) contacted:  Spencer Bradley, mom, [phone] Patient understands diagnosis: Yes. Discussing patient identified problems/goals with staff: Yes. Medical problems stabilized or resolved: Yes. Denies suicidal/homicidal ideation: Yes. Issues/concerns per patient self-inventory: No.   Patient Goals:  to work on better communication with my family and friends, continue counseling.  Discharge Plan or Barriers: Patient will likely discharge home once stable.   Reason for Continuation of Hospitalization: Depression Medication stabilization  Estimated Length of Stay: 3-5 days  Last 3 [place] Suicide Severity Risk Score: Flowsheet Row [facility] (Current) [facility]03/2024 in [facility]  C-SSRS RISK CATEGORY High Risk High Risk    Last Lv [facility] 2/9 Scores:    02/03/2024    5:41 PM 10/31/2021   12:48 PM 07/09/2020    1:49 PM  Depression screen PHQ 2/9  Decreased Interest 3 1 3   Down, Depressed, Hopeless 3 0 3  PHQ - 2 Score 6 1 6   Altered sleeping 3 3 1   Tired, decreased energy 1 0 3  Change in appetite 2 1 2   Feeling bad or failure about yourself  3 0 3  Trouble concentrating 3 0 1  Moving slowly or fidgety/restless 0 0 1  Suicidal thoughts 3  0  PHQ-9 Score 21 5 17   Difficult doing work/chores Very difficult  Very difficult    Scribe for Treatment Team: Spencer Bradley, [place] 02/06/2024 12:08 PM

## 2024-02-06 NOTE — [facility] Suicide Risk Assessment (Signed)
 [facility] INPATIENT:  Family/Significant Other Suicide Prevention Education  Suicide Prevention Education:  Education Completed; Norm Wray, mom, [phone],  (name of family member/significant other) has been identified by the patient as the family member/significant other with whom the patient will be residing, and identified as the person(s) who will aid the patient in the event of a mental health crisis (suicidal ideations/suicide attempt).  With written consent from the patient, the family member/significant other has been provided the following suicide prevention education, prior to the and/or following the discharge of the patient.  Melissa confirmed patient will not have access to firearms/guns/weapons to hurt himself or others. Melissa reported she would be able to assist patient should he have a mental health crisis. Melissa reported having no safety concerns related to patient safely discharging home.   The suicide prevention education provided includes the following: Suicide risk factors Suicide prevention and interventions National Suicide Hotline telephone number [facility] assessment telephone number [facility] Emergency Assistance 911 [facility] and/or Residential Mobile Crisis Unit telephone number  Request made of family/significant other to: Remove weapons (e.g., guns, rifles, knives), all items previously/currently identified as safety concern.   Remove drugs/medications (over-the-counter, prescriptions, illicit drugs), all items previously/currently identified as a safety concern.  The family member/significant other verbalizes understanding of the suicide prevention education information provided.  The family member/significant other agrees to remove the items of safety concern listed above.  Spencer Bradley, LCSWA 02/06/2024, 3:13 PM

## 2024-02-06 NOTE — Progress Notes (Incomplete)
(  Sleep Hours) -6.75  (Any PRNs that were needed, meds refused, or side effects to meds)- none (Any disturbances and when (visitation, over night)- none (Concerns raised by the patient)- none (SI/HI/AVH)- denies all

## 2024-02-07 NOTE — Progress Notes (Signed)
  [facility] Adult Case Management Discharge Plan :  Will you be returning to the same living situation after discharge:  Yes,  pt returning home after discharge At discharge, do you have transportation home?: Yes,  pt reported his father will be providing transportation. Do you have the ability to pay for your medications: Yes,  pt is insured - [place] [employer]  Release of information consent forms completed and in the chart;  Patient's signature needed at discharge.  Patient to Follow up at:  Follow-up Information     [facility] Go on 02/16/2024.   Specialty: Professional Counselor Why: You have an appointment with Cheryl Rudder on 02/16/24 at 10:00am for therapy. Please call to reschedule should this date and time not work due to your school schedule. Please complete your paperwork via the client portal at least 2 days prior to appointment. Contact information: [address] [place] [address] [phone]         [facility] Go on 02/24/2024.   Why: You have an appointment for medication management 02/24/24 at 3:30 pm, in person.  (You may call to switch to Virtual). Contact information: [address] [place] [address] [phone]                 Next level of care provider has access to [facility] Link:no  Safety Planning and Suicide Prevention discussed: Yes,  completed with Graves Nipp, mom, [phone].     Has patient been referred to the Quitline?: Patient does not use tobacco/nicotine products  Patient has been referred for addiction treatment: No known substance use disorder.  Eyla Tallon M Felicite Zeimet, LCSWA 02/07/2024, 8:02 AM

## 2024-02-07 NOTE — Plan of Care (Signed)
  Problem: Education: Goal: Knowledge of [place] [facility] Education information/materials will improve Outcome: Completed/Met Goal: Emotional status will improve Outcome: Completed/Met Goal: Mental status will improve 02/07/2024 0836 by Deidre Sartorius, RN Outcome: Completed/Met 02/07/2024 0835 by Deidre Sartorius, RN Outcome: Adequate for Discharge Goal: Verbalization of understanding the information provided will improve Outcome: Completed/Met   Problem: Activity: Goal: Interest or engagement in activities will improve Outcome: Completed/Met Goal: Sleeping patterns will improve Outcome: Completed/Met   Problem: Coping: Goal: Ability to verbalize frustrations and anger appropriately will improve Outcome: Completed/Met Goal: Ability to demonstrate self-control will improve Outcome: Completed/Met

## 2024-02-07 NOTE — Discharge Instructions (Addendum)
 Hi, Spencer Bradley!  Here are some recommendations (below) for your further care. It was nice getting to know you a little bit! I wish you the best going forward. Good luck with football and school!!  Odis Cleveland, MD  -Follow-up with your outpatient psychiatric provider -instructions on appointment date, time, and address (location) are provided to you in discharge paperwork.  -Take your psychiatric medications as prescribed at discharge - instructions are provided to you in the discharge paperwork  -Follow-up with outpatient primary care doctor and other specialists -for management of chronic medical disease, including: high cholesterol (hyperlipidemia)   -Testing: Follow-up with outpatient provider for abnormal lab results: LDL > 120   -Recommend abstinence from alcohol, tobacco, and other illicit drug use at discharge.   -If your psychiatric symptoms recur, worsen, or if you have side effects to your psychiatric medications, call your outpatient psychiatric provider, 911, 988 or go to the nearest emergency department.  -If suicidal thoughts recur, call your outpatient psychiatric provider, 911, 988 or go to the nearest emergency department.

## 2024-02-07 NOTE — [facility] Group Notes (Signed)
 [facility] Group Notes:  (Nursing/MHT/Case Management/Adjunct)  Date:  02/07/2024  Time:  5:18 AM  Type of Therapy:  Wrap up  Participation Level:  Active  Participation Quality:  Appropriate  Affect:  Appropriate  Cognitive:  Appropriate  Insight:  Appropriate  Engagement in Group:  Engaged  Modes of Intervention:  Education  Summary of Progress/Problems: Goal to be D/C. Rated day 10/10.  Spencer Bradley [place] 02/07/2024, 5:18 AM

## 2024-02-07 NOTE — Progress Notes (Signed)
   02/07/24 0800  Psych Admission Type (Psych Patients Only)  Admission Status Voluntary  Psychosocial Assessment  Patient Complaints None  Eye Contact Fair  Facial Expression Animated  Affect Appropriate to circumstance  Speech Logical/coherent  Interaction Assertive  Motor Activity Other (Comment) (WNL for pt)  Appearance/Hygiene Unremarkable  Behavior Characteristics Appropriate to situation;Calm  Mood Pleasant  Thought Process  Coherency WDL  Content WDL  Delusions None reported or observed  Perception WDL  Hallucination None reported or observed  Judgment WDL  Confusion None  Danger to Self  Current suicidal ideation? Denies  Agreement Not to Harm Self Yes  Description of Agreement verbal  Danger to Others  Danger to Others None reported or observed

## 2024-02-07 NOTE — Progress Notes (Incomplete)
 Shift Note  (Sleep Hours) -7.75  (Any PRNs that were needed, meds refused, or side effects to meds)- None  (Any disturbances and when visitation, over night)- None  (Concerns raised by the patient)- None  (SI/HI/AVH)- Denies

## 2024-02-07 NOTE — Progress Notes (Signed)
 Patient ID: Spencer Bradley, male   DOB: 2005-12-20, 18 y.o.   MRN: [id number] Medications, follow up appointments and discharge instructions reviewed, pt verbalized understanding. Pt belongings returned to his satisfaction, pt dc home.

## 2024-02-07 NOTE — Plan of Care (Signed)
  Problem: Education: Goal: Knowledge of [place] [facility] Education information/materials will improve Outcome: Completed/Met Goal: Emotional status will improve Outcome: Completed/Met Goal: Mental status will improve Outcome: Adequate for Discharge Goal: Verbalization of understanding the information provided will improve Outcome: Completed/Met
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
## 2024-02-07 NOTE — [facility] Suicide Risk Assessment (Signed)
 [facility] Discharge Suicide Risk Assessment   Principal Problem: Major depressive disorder, recurrent severe without psychotic features [name] Discharge Diagnoses: Principal Problem:   Major depressive disorder, recurrent severe without psychotic features (HCC)   Total Time spent with patient: 2 hours  Spencer Bradley is a 18 y.o. 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 Upon arrival he was denying SI, however reported ongoing depression and recent SI with thoughts of shooting himself or jumping off a deck. He did not act on these thoughts although he did have a gun at one point that he later returned. Labs were notable for CBC and CMP WNL, A1c 4.7, LDL 120 H, TSH, BAL and UDS negative, After discussion with providers at [facility], he was open to voluntary admission but requested no one reach out to family. Transferred to [facility] overnight 9/5-9/6.   During the patient's hospitalization, patient had extensive initial psychiatric evaluation, and follow-up psychiatric evaluations every day.  Psychiatric diagnoses provided upon initial assessment:  Major Depressive Disorder, recurrent, moderate-severe, without psychotic features   Patient's psychiatric medications were adjusted on admission:   None, patient declined psychotropic medication.   During the hospitalization, other adjustments were made to the patient's psychiatric medication regimen:   None, patient declined psychotropic medication.   Patient's care was discussed during the interdisciplinary team meeting every day during the hospitalization.  Gradually, patient started adjusting to milieu. The patient was evaluated each day by a clinical provider to ascertain response to treatment. Improvement was  noted by the patient's report of decreasing symptoms, improved sleep and appetite, affect, behavior, and participation in unit programming.  Patient was asked each day to complete a self inventory noting mood, mental status, pain, new symptoms, anxiety and concerns.    Symptoms were reported as significantly decreased or resolved completely by discharge.   On day of discharge, the patient reports that their mood is stable. The patient denied having suicidal thoughts for more than 48 hours prior to discharge.  Patient denies having homicidal thoughts.  Patient denies having auditory hallucinations.  Patient denies any visual hallucinations or other symptoms of psychosis.  The patient reports their target psychiatric symptoms of depressed mood responded well to inpatient psychotherapeutic intervention, and the patient reports overall benefit other psychiatric hospitalization. Supportive psychotherapy was provided to the patient. The patient also participated in regular group therapy while hospitalized. Coping skills, problem solving as well as relaxation therapies were also part of the unit programming.  Labs were reviewed with the patient, and abnormal results were discussed with the patient.  The patient is able to verbalize their individual safety plan to this provider.  # It is recommended to the patient to continue psychiatric medications as prescribed, after discharge from the hospital.    # It is recommended to the patient to follow up with your outpatient psychiatric provider and PCP.  # It was discussed with the patient, the impact of alcohol, drugs, tobacco have been there overall psychiatric and medical wellbeing, and total abstinence from substance use was recommended the patient.ed.  # Prescriptions provided or sent directly to preferred pharmacy at discharge. Patient agreeable to plan. Given opportunity to ask questions. Appears to feel comfortable with discharge.    # In the event of  worsening symptoms, the patient  is instructed to call the crisis hotline, 911 and or go to the nearest ED for appropriate evaluation and treatment of symptoms. To follow-up with primary care provider for other medical issues, concerns and or health care needs  # Patient was discharged home with a plan to follow up as noted below.   On day of discharge, patient denied suicidal and homicidal ideation. Appeared euthymic and very forward-thinking: ready to get back to class today, start football again, see friends and family. Denied auditory and visual hallucinations. No somatic side effects. Made aware of emergency options available to him (911, 988, [facility]) should depressed mood return.    Mental Status exam: Appearance: black male of average BMI, hair in neat braids, appropriately groomed in casual clothing, interviewed in hall Eye contact: good Attitude towards examiner ooperative  Psychomotor: no agitation or retardation  Speech: WNL Language: no delays  Mood: good Affect: congruent, euthymic today Thought content: denying SI and HI, no delusions expressed  Thought Process: linear, organized and goal-directed  Perception: denying AVH, not RTIS  Insight: good  Judgement: fair based on recent events    Orientation: x4 Attention/Concentration: fair - attends to interview  Memory/Cognition: recent and remote memory grossly intact on conversation   Fund of Knowledge: Average      Musculoskeletal: Strength & Muscle Tone: within normal limits Gait & Station: normal Patient leans: N/A   Physical Exam Constitutional:      Appearance: Normal appearance.  HENT:     Head: Normocephalic and atraumatic.  Pulmonary:     Effort: Pulmonary effort is normal.  Abdominal:     General: There is no distension.  Musculoskeletal:        General: Normal range of motion.     Cervical back: Normal range of motion.  Neurological:     General: No focal deficit present.     Mental Status: He is  alert.     ROS Blood pressure 111/73, pulse (!) 53, temperature (!) 97.5 F (36.4 C), temperature source Oral, resp. rate 16, height 5' 10 (1.778 m), weight 73.8 kg, SpO2 100%. Body mass index is 23.33 kg/m.  Mental Status Per Nursing Assessment::   On Admission:  Suicidal ideation indicated by patient, Suicidal ideation indicated by others, Suicide plan  Demographic Factors:  Male and Adolescent or young adult Loss Factors: NA Historical Factors: Impulsivity Risk Reduction Factors:   Sense of responsibility to family, Positive social support, and Positive therapeutic relationship   Continued Clinical Symptoms:  Depression:   Recent sense of peace/wellbeing  Cognitive Features That Contribute To Risk:  None    Suicide Risk:  Mild: There are no identifiable suicide plans, no associated intent, absence of dysphoria and related symptoms, improved self-control (both objective and subjective assessment), few other risk factors, and identifiable protective factors, including available and accessible social support.   Follow-up Information     [facility] Go on 02/16/2024.   Specialty: Professional Counselor Why: You have an appointment with Cheryl Rudder on 02/16/24 at 10:00am for therapy. Please call to reschedule should this date and time not work due to your school schedule. Please complete your paperwork via the client portal at least 2 days prior to appointment. Contact information: [address] [place] [address] [phone]         [facility] Go on 02/24/2024.   Why: You have an appointment for medication management 02/24/24 at 3:30 pm, in person.  (You may call to switch to Virtual).  Contact information: [address] [place] [address] [phone]                 Plan Of Care/Follow-up recommendations:  Hi, Spencer Bradley!   Here are some recommendations (below) for your further care. It was nice getting to know  you a little bit! I wish you the best going forward. Good luck with football and school!!   Odis Cleveland, MD   -Follow-up with your outpatient psychiatric provider -instructions on appointment date, time, and address (location) are provided to you in discharge paperwork.   -Take your psychiatric medications as prescribed at discharge - instructions are provided to you in the discharge paperwork   -Follow-up with outpatient primary care doctor and other specialists -for management of chronic medical disease, including: high cholesterol (hyperlipidemia)    -Testing: Follow-up with outpatient provider for abnormal lab results: LDL > 120    -Recommend abstinence from alcohol, tobacco, and other illicit drug use at discharge.    -If your psychiatric symptoms recur, worsen, or if you have side effects to your psychiatric medications, call your outpatient psychiatric provider, 911, 988 or go to the nearest emergency department.   -If suicidal thoughts recur, call your outpatient psychiatric provider, 911, 988 or go to the nearest emergency department.    Sammie Schermerhorn, MD 02/07/2024, 7:33 AM

## 2024-02-10 NOTE — Progress Notes (Signed)

## 2024-02-17 ENCOUNTER — Encounter: Payer: Self-pay | Admitting: *Deleted
